# Patient Record
Sex: Male | Born: 2000 | Race: Black or African American | Hispanic: No | Marital: Single | Smoking: Never smoker
Health system: Southern US, Community
[De-identification: ages and names within clinical notes are randomized; demographics above are authoritative.]

## PROBLEM LIST (undated history)

## (undated) DIAGNOSIS — L409 Psoriasis, unspecified: Secondary | ICD-10-CM

## (undated) DIAGNOSIS — J45909 Unspecified asthma, uncomplicated: Secondary | ICD-10-CM

## (undated) HISTORY — DX: Psoriasis, unspecified: L40.9

---
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
## 2019-05-07 IMAGING — CR DG CHEST 2V
1 series · 2 of 2 positions shown · non-contrast
Comparison: 12/26/2007

CLINICAL DATA: wheezing and congestion x a few days. Denies known
fever. Reports living with his aunt for a while who had cats,
reports the symptoms may be related. Hx of asthma. Non-smoker. Best
obtainable images- patient with hair beads, unable to fully remove
from field of view for images.

EXAM:
CHEST - 2 VIEW

[Series 1: dg chest 2 view · 0.14mm/px · 2 of 2 slices shown]
[im 1/2]
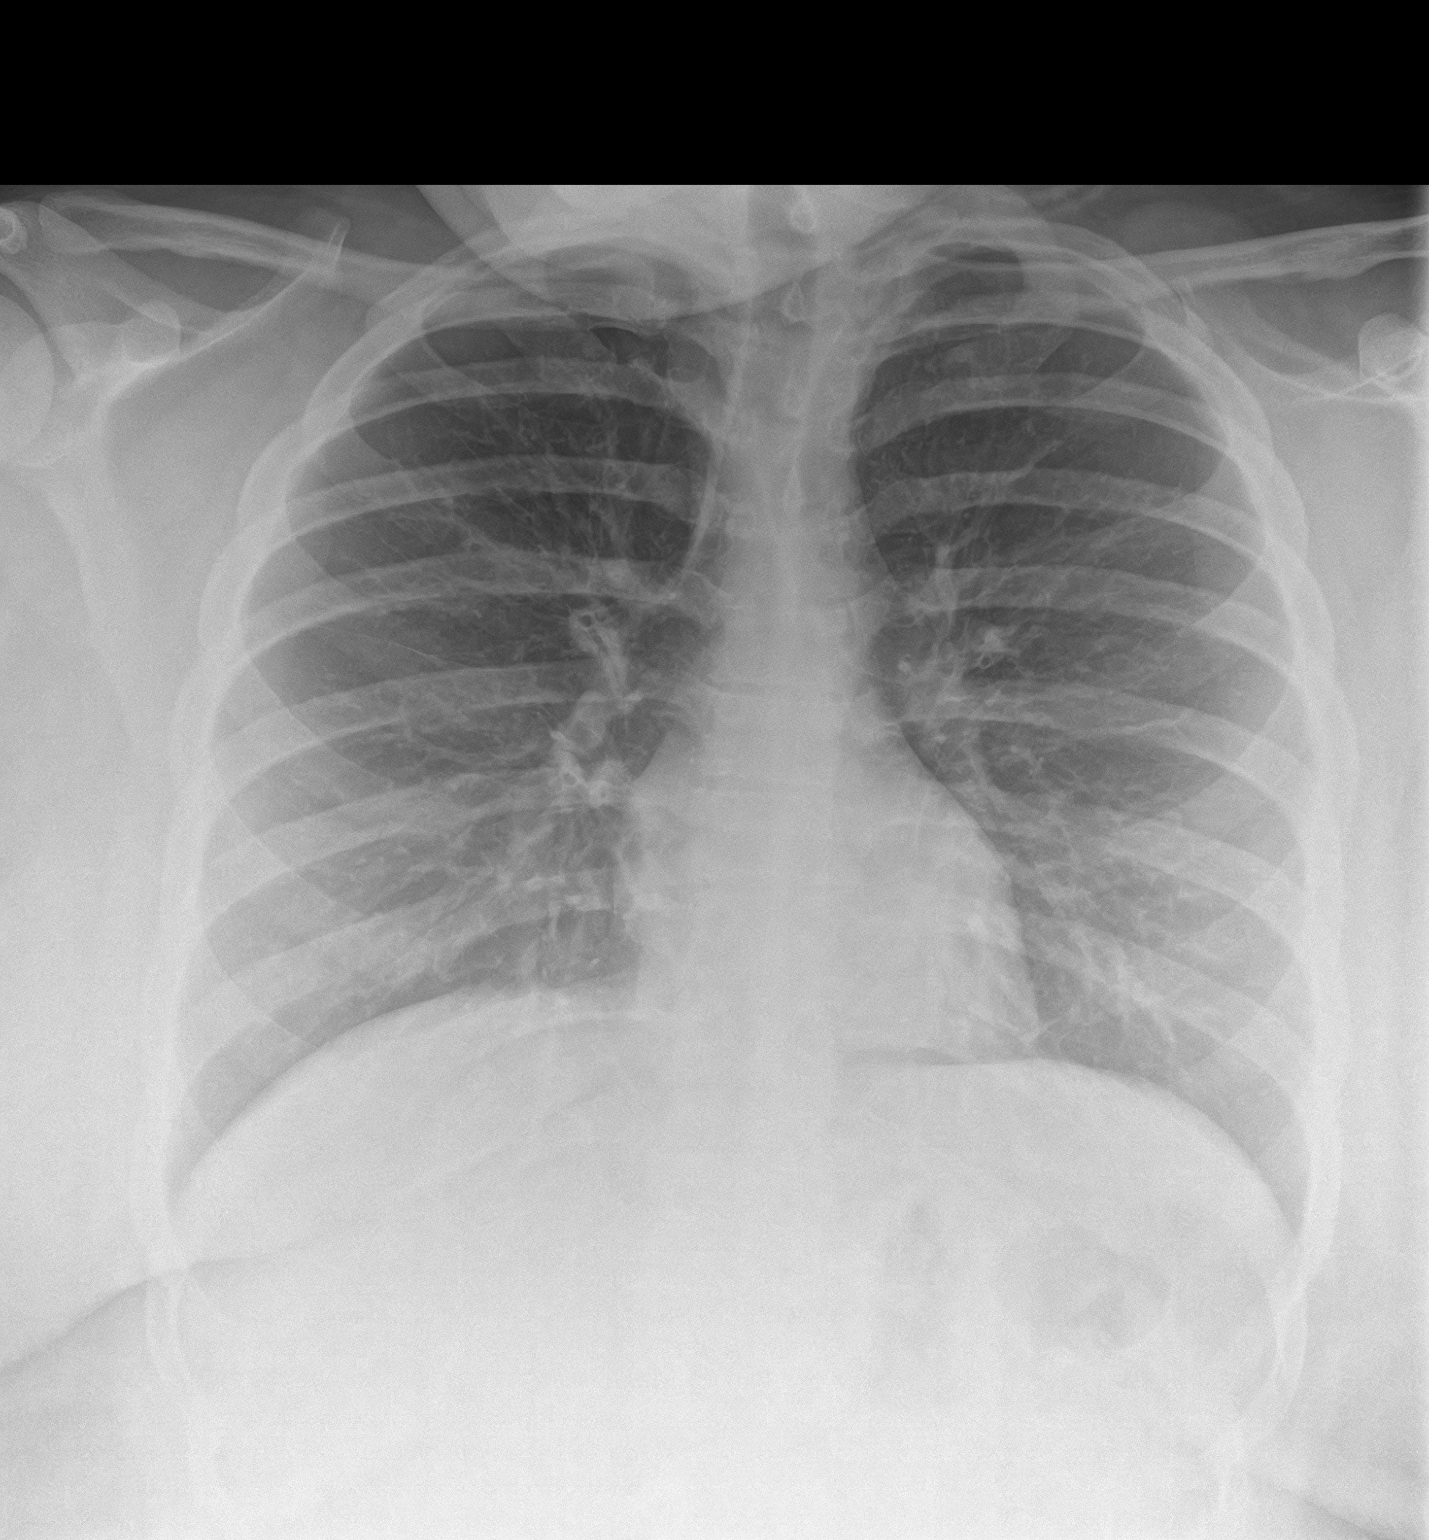
[im 2/2]
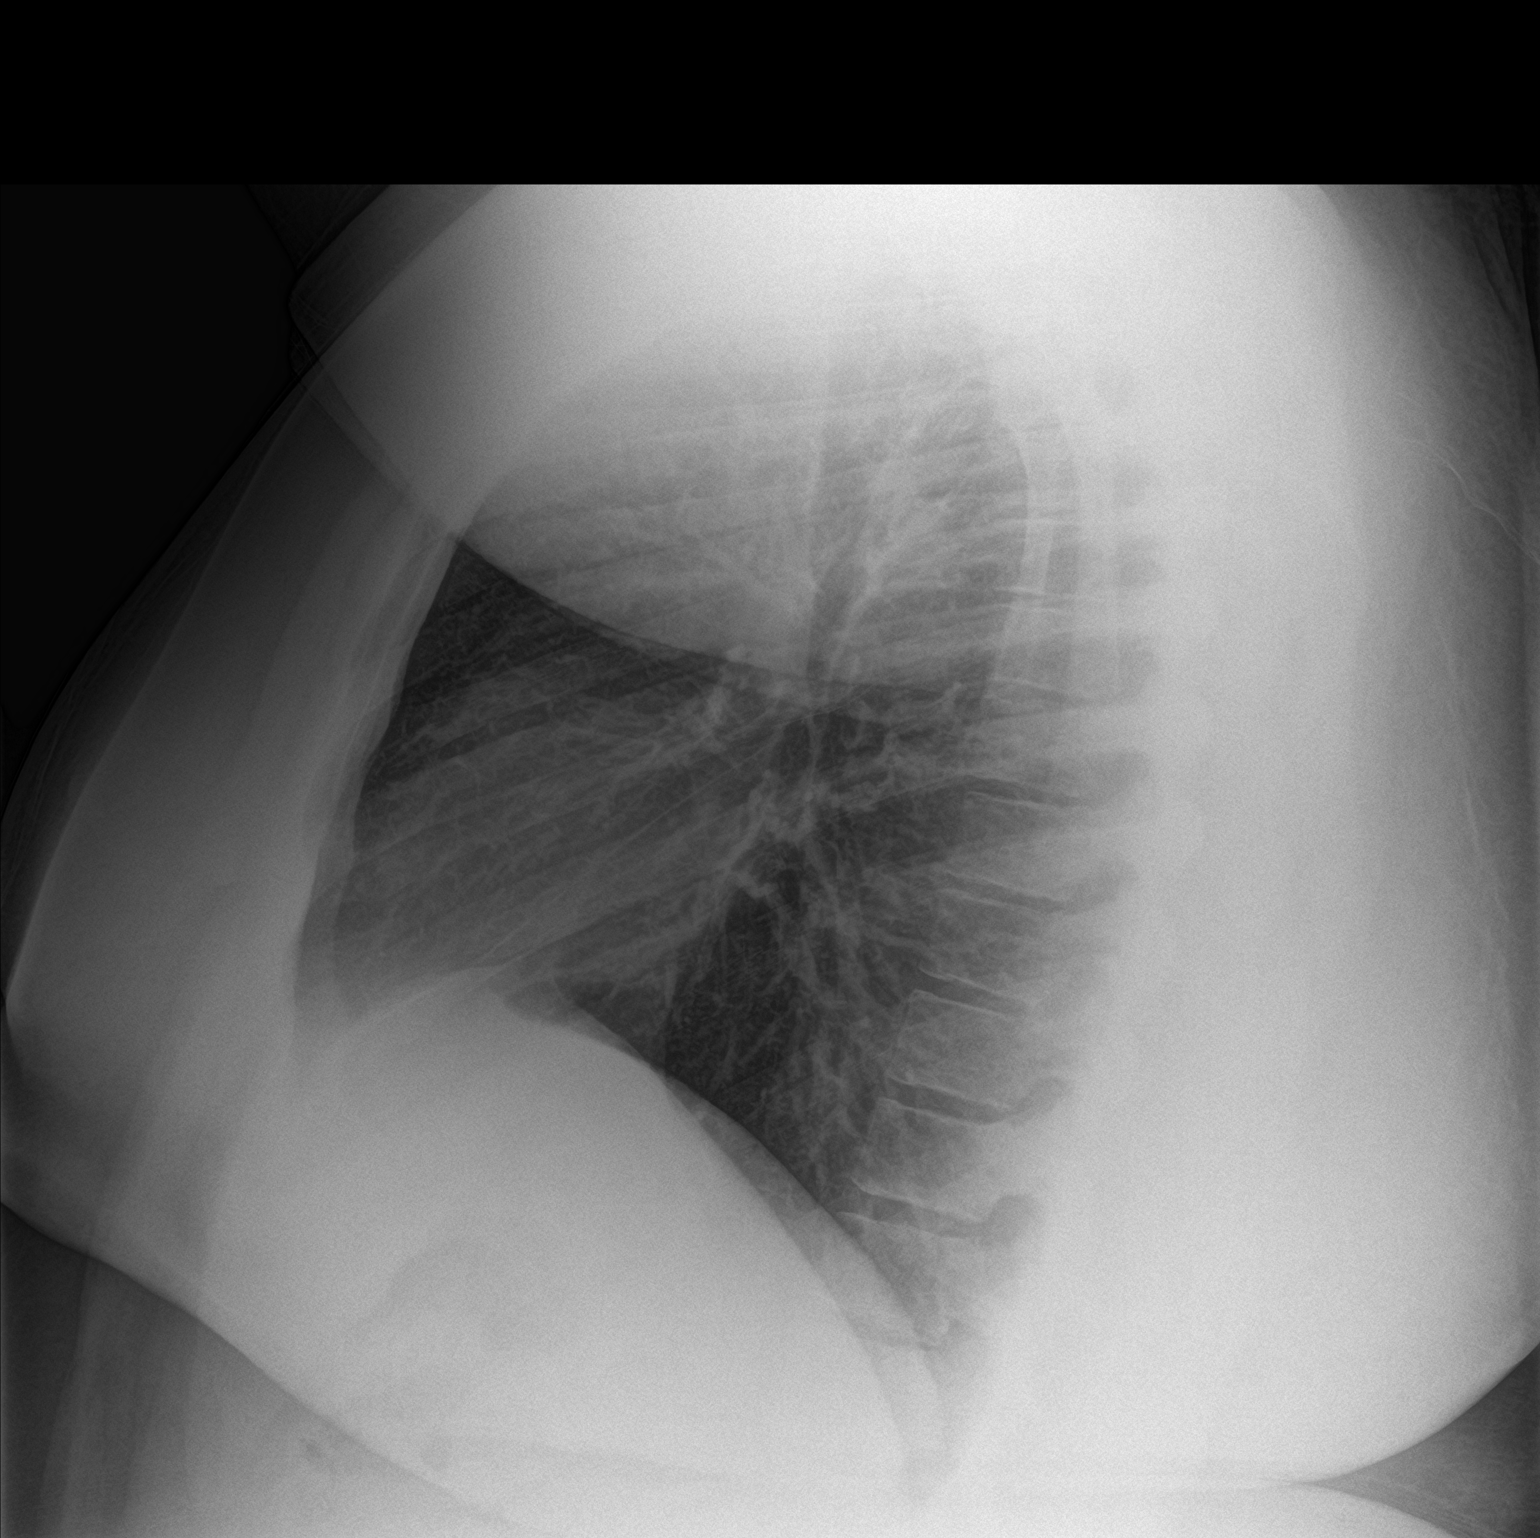

[2 of 2 positions shown; findings below may reference images not displayed]

FINDINGS: Lungs are clear. Somewhat coarse perihilar bronchovascular markings
as before.

Heart size and mediastinal contours are within normal limits.

No effusion.

Visualized bones unremarkable.
IMPRESSION: No acute cardiopulmonary disease.

## 2019-05-18 ENCOUNTER — Ambulatory Visit: Payer: Self-pay | Admitting: Family Medicine

## 2019-06-25 DIAGNOSIS — Z79899 Other long term (current) drug therapy: Secondary | ICD-10-CM | POA: Diagnosis not present

## 2019-06-25 DIAGNOSIS — F640 Transsexualism: Secondary | ICD-10-CM | POA: Diagnosis not present

## 2020-01-30 DIAGNOSIS — Z6841 Body Mass Index (BMI) 40.0 and over, adult: Secondary | ICD-10-CM | POA: Diagnosis not present

## 2020-10-02 ENCOUNTER — Ambulatory Visit: Payer: Medicaid Other | Admitting: Family Medicine

## 2020-12-03 DIAGNOSIS — R5383 Other fatigue: Secondary | ICD-10-CM | POA: Diagnosis not present

## 2020-12-03 DIAGNOSIS — Z1159 Encounter for screening for other viral diseases: Secondary | ICD-10-CM | POA: Diagnosis not present

## 2020-12-03 DIAGNOSIS — Z0000 Encounter for general adult medical examination without abnormal findings: Secondary | ICD-10-CM | POA: Diagnosis not present

## 2020-12-03 DIAGNOSIS — L409 Psoriasis, unspecified: Secondary | ICD-10-CM | POA: Diagnosis not present

## 2020-12-03 DIAGNOSIS — Z789 Other specified health status: Secondary | ICD-10-CM | POA: Diagnosis not present

## 2020-12-03 DIAGNOSIS — E559 Vitamin D deficiency, unspecified: Secondary | ICD-10-CM | POA: Diagnosis not present

## 2020-12-03 DIAGNOSIS — Z131 Encounter for screening for diabetes mellitus: Secondary | ICD-10-CM | POA: Diagnosis not present

## 2020-12-03 DIAGNOSIS — Z1339 Encounter for screening examination for other mental health and behavioral disorders: Secondary | ICD-10-CM | POA: Diagnosis not present

## 2020-12-03 DIAGNOSIS — Z13220 Encounter for screening for lipoid disorders: Secondary | ICD-10-CM | POA: Diagnosis not present

## 2021-08-13 ENCOUNTER — Encounter: Payer: Self-pay | Admitting: Family Medicine

## 2021-08-13 ENCOUNTER — Ambulatory Visit (INDEPENDENT_AMBULATORY_CARE_PROVIDER_SITE_OTHER): Payer: 59 | Admitting: Family Medicine

## 2021-08-13 VITALS — BP 132/82 | HR 88 | Ht 67.0 in | Wt >= 6400 oz

## 2021-08-13 DIAGNOSIS — K5904 Chronic idiopathic constipation: Secondary | ICD-10-CM | POA: Diagnosis not present

## 2021-08-13 DIAGNOSIS — L409 Psoriasis, unspecified: Secondary | ICD-10-CM | POA: Diagnosis not present

## 2021-08-13 DIAGNOSIS — L400 Psoriasis vulgaris: Secondary | ICD-10-CM | POA: Insufficient documentation

## 2021-08-13 MED ORDER — HYDROXYZINE PAMOATE 25 MG PO CAPS: 25.0000 mg | ORAL_CAPSULE | Freq: Four times a day (QID) | ORAL | 0 refills | Status: DC | PRN

## 2021-08-13 MED ORDER — SENNOSIDES-DOCUSATE SODIUM 8.6-50 MG PO TABS: 2.0000 | ORAL_TABLET | Freq: Two times a day (BID) | ORAL | 0 refills | Status: DC

## 2021-08-13 MED ORDER — METHYLPREDNISOLONE 4 MG PO TBPK: ORAL_TABLET | ORAL | 0 refills | Status: DC

## 2021-08-13 MED ORDER — POLYETHYLENE GLYCOL 3350 17 G PO PACK: 17.0000 g | PACK | Freq: Two times a day (BID) | ORAL | 11 refills | Status: DC

## 2021-08-13 NOTE — Assessment & Plan Note (Addendum)
Chronic issue with worsening symptomatology over the past 1 week, does describe change in eating habits roughly 1 week prior.  Describes bowel movements every several days, abdominal pain that alleviates after defecation, feeling incomplete bowel movements, no nausea emesis or blood in stool endorsed, no treatments to date.  Abdominal examination does reveal extensive psoriatic plaques on inspection, abdomen is soft, nontender, hypoactive bowel sounds, no hepatosplenomegaly.  Stated symptomatology is most consistent with chronic constipation with recent exacerbation.  Lifestyle modification methods reviewed, additionally I have advised her to initiate scheduled MiraLAX, and incorporate senna-docusate if bowel movement not achieved after 3 days of MiraLAX.  Patient is to continue this medication until follow-up in 1 month.

## 2021-08-13 NOTE — Progress Notes (Signed)
Primary Care / Sports Medicine Office Visit  Patient Information:  Patient ID: Bobby Russo, adult DOB: 2000-09-02 Age: 21 y.o. MRN: [id number]   Bobby Russo is a pleasant 21 y.o. adult presenting with the following:  Chief Complaint  Patient presents with   Establish Care    Hormone therapy, derm referral, bowel movements are not regular causing stomach issues    Vitals:   08/13/21 1330  BP: 132/82  Pulse: 88  SpO2: 98%   Vitals:   08/13/21 1330  Weight: (!) 439 lb (199.1 kg)  Height: 5\' 7"  (1.702 m)   Body mass index is 68.76 kg/m.  No results found.   Independent interpretation of notes and tests performed by another provider:   None  Procedures performed:   None  Pertinent History, Exam, Impression, and Recommendations:   Problem List Items Addressed This Visit       Digestive   Chronic idiopathic constipation    Chronic issue with worsening symptomatology over the past 1 week, does describe change in eating habits roughly 1 week prior.  Describes bowel movements every several days, abdominal pain that alleviates after defecation, feeling incomplete bowel movements, no nausea emesis or blood in stool endorsed, no treatments to date.  Abdominal examination does reveal extensive psoriatic plaques on inspection, abdomen is soft, nontender, hypoactive bowel sounds, no hepatosplenomegaly.  Stated symptomatology is most consistent with chronic constipation with recent exacerbation.  Lifestyle modification methods reviewed, additionally I have advised her to initiate scheduled MiraLAX, and incorporate senna-docusate if bowel movement not achieved after 3 days of MiraLAX.  Patient is to continue this medication until follow-up in 1 month.       Relevant Medications   polyethylene glycol (MIRALAX / GLYCOLAX) 17 g packet   senna-docusate (SENOKOT-S) 8.6-50 MG tablet     Musculoskeletal and Integument   Plaque psoriasis - Primary    Patient presents for  establishment of care and with concern over severe flare of psoriasis.  Patient was diagnosed during her early teens, after varying topical treatments with limited response, was started on Biologics through dermatology with excellent response.  Since moving to [place] roughly 1 year prior, has been off of any medications and has noted significant progressive worsening.  Examination today reveals extensive plaque psoriasis throughout the face, scalp, back, abdomen, and extremities.  Given the broad distribution and severity of findings I have advised course of steroids, urgent referral to dermatology, and supportive care over interim.       Relevant Medications   methylPREDNISolone (MEDROL DOSEPAK) 4 MG TBPK tablet   hydrOXYzine (VISTARIL) 25 MG capsule     Orders & Medications Meds ordered this encounter  Medications   methylPREDNISolone (MEDROL DOSEPAK) 4 MG TBPK tablet    Sig: Take per package instructions for full course, 6, 5, 4, 3, 2, 1    Dispense:  21 tablet    Refill:  0   hydrOXYzine (VISTARIL) 25 MG capsule    Sig: Take 1 capsule (25 mg total) by mouth every 6 (six) hours as needed for itching.    Dispense:  30 capsule    Refill:  0   polyethylene glycol (MIRALAX / GLYCOLAX) 17 g packet    Sig: Take 17 g by mouth 2 (two) times daily. Until stooling regularly    Dispense:  30 packet    Refill:  11   senna-docusate (SENOKOT-S) 8.6-50 MG tablet    Sig: Take 2 tablets by mouth 2 (two) times  daily. Until stooling regularly    Dispense:  60 tablet    Refill:  0   Orders Placed This Encounter  Procedures   Ambulatory referral to Dermatology     Return in about 1 month (around 09/13/2021) for physical.     Montel Culver, MD   [place]

## 2021-08-13 NOTE — Patient Instructions (Signed)
-   Start Medrol Dosepak (steroid) course - Can use hydroxyzine on an as-needed basis for itching - Review information provided on skin care until you are able to establish with dermatology - Start MiraLAX daily, can increase every 3 days, aim for once daily smooth bowel movement - If no bowel movement after 3 days of MiraLAX, dose senna-docusate up to twice a day - Review information provided on lifestyle/diet change - Return for follow-up in 1 month for annual physical, contact for any questions between now and then

## 2021-08-13 NOTE — Assessment & Plan Note (Addendum)
Patient presents for establishment of care and with concern over severe flare of psoriasis.  Patient was diagnosed during her early teens, after varying topical treatments with limited response, was started on Biologics through dermatology with excellent response.  Since moving to [place] roughly 1 year prior, has been off of any medications and has noted significant progressive worsening.  Examination today reveals extensive plaque psoriasis throughout the face, scalp, back, abdomen, and extremities.  Given the broad distribution and severity of findings I have advised course of steroids, urgent referral to dermatology, and supportive care over interim.

## 2021-09-11 ENCOUNTER — Ambulatory Visit (INDEPENDENT_AMBULATORY_CARE_PROVIDER_SITE_OTHER): Payer: 59 | Admitting: Family Medicine

## 2021-09-11 ENCOUNTER — Encounter: Payer: Self-pay | Admitting: Family Medicine

## 2021-09-11 VITALS — BP 134/84 | HR 88 | Ht 67.0 in | Wt >= 6400 oz

## 2021-09-11 DIAGNOSIS — L400 Psoriasis vulgaris: Secondary | ICD-10-CM | POA: Diagnosis not present

## 2021-09-11 DIAGNOSIS — Z0000 Encounter for general adult medical examination without abnormal findings: Secondary | ICD-10-CM | POA: Diagnosis not present

## 2021-09-11 DIAGNOSIS — Z1159 Encounter for screening for other viral diseases: Secondary | ICD-10-CM | POA: Diagnosis not present

## 2021-09-11 DIAGNOSIS — R69 Illness, unspecified: Secondary | ICD-10-CM | POA: Diagnosis not present

## 2021-09-11 DIAGNOSIS — Z13220 Encounter for screening for lipoid disorders: Secondary | ICD-10-CM | POA: Diagnosis not present

## 2021-09-11 DIAGNOSIS — B351 Tinea unguium: Secondary | ICD-10-CM | POA: Diagnosis not present

## 2021-09-11 DIAGNOSIS — K5904 Chronic idiopathic constipation: Secondary | ICD-10-CM | POA: Diagnosis not present

## 2021-09-11 DIAGNOSIS — Z114 Encounter for screening for human immunodeficiency virus [HIV]: Secondary | ICD-10-CM | POA: Diagnosis not present

## 2021-09-11 DIAGNOSIS — Z789 Other specified health status: Secondary | ICD-10-CM | POA: Insufficient documentation

## 2021-09-11 DIAGNOSIS — R7989 Other specified abnormal findings of blood chemistry: Secondary | ICD-10-CM | POA: Diagnosis not present

## 2021-09-11 DIAGNOSIS — R400 Somnolence: Secondary | ICD-10-CM

## 2021-09-11 NOTE — Assessment & Plan Note (Signed)
Patient did note mild improvement following oral steroid course, does have upcoming visit with dermatology.  Pruritus has diminished and does continue to use hydroxyzine on a as needed basis.

## 2021-09-11 NOTE — Assessment & Plan Note (Signed)
Continued improvement noted following lifestyle changes as well as MiraLAX regular dosing, does use senna-docusate for breakthrough symptoms.

## 2021-09-11 NOTE — Assessment & Plan Note (Signed)
Annual examination completed, risk stratification labs ordered, anticipatory guidance provided.  We will follow labs once resulted.  Deferred Tdap.

## 2021-09-11 NOTE — Assessment & Plan Note (Signed)
Incidentally noted on today's physical examination, is amenable to further evaluation management by podiatry, referral placed today.

## 2021-09-11 NOTE — Progress Notes (Signed)
Annual Physical Exam Visit  Patient Information:  Patient ID: Bobby Russo, adult DOB: [birth year] Age: 21 y.o. MRN: [id number]   Subjective:   CC: Annual Physical Exam  HPI:  Bobby Russo is here for their annual physical.  I reviewed the past medical history, family history, social history, surgical history, and allergies today and changes were made as necessary.  Please see the problem list section below for additional details.  Past Medical History: Past Medical History:  Diagnosis Date   Asthma    Psoriasis    Past Surgical History: History reviewed. No pertinent surgical history. Family History: Family History  Problem Relation Age of Onset   Diabetes Maternal Grandmother    Allergies: Allergies  Allergen Reactions   Bee Venom Other (See Comments)    unknown unknown   Health Maintenance: Health Maintenance  Topic Date Due   COVID-19 Vaccine (1) Never done   HPV VACCINES (1 - 2-dose series) Never done   Hepatitis C Screening  Never done   PAP-Cervical Cytology Screening  Never done   PAP SMEAR-Modifier  Never done   TETANUS/TDAP  09/12/2022 (Originally 02/10/2018)   INFLUENZA VACCINE  10/27/2021   HIV Screening  Completed    HM Colonoscopy     This patient has no relevant Health Maintenance data.      Medications: Current Outpatient Medications on File Prior to Visit  Medication Sig Dispense Refill   hydrOXYzine (VISTARIL) 25 MG capsule Take 1 capsule (25 mg total) by mouth every 6 (six) hours as needed for itching. 30 capsule 0   polyethylene glycol (MIRALAX / GLYCOLAX) 17 g packet Take 17 g by mouth 2 (two) times daily. Until stooling regularly 30 packet 11   senna-docusate (SENOKOT-S) 8.6-50 MG tablet Take 2 tablets by mouth 2 (two) times daily. Until stooling regularly 60 tablet 0   No current facility-administered medications on file prior to visit.    Review of Systems: No headache, visual changes, nausea, vomiting, diarrhea,  constipation, dizziness, abdominal pain, skin rash, fevers, chills, night sweats, swollen lymph nodes, weight loss, chest pain, body aches, joint swelling, muscle aches, shortness of breath, mood changes, visual or auditory hallucinations reported.  Objective:   Vitals:   09/11/21 1340  BP: 134/84  Pulse: 88  SpO2: 98%   Vitals:   09/11/21 1340  Weight: (!) 435 lb (197.3 kg)  Height: 5\' 7"  (1.702 m)   Body mass index is 68.13 kg/m.  General: Well Developed, obese, well nourished, and in no acute distress.  Neuro: Alert and oriented x3, extra-ocular muscles intact, sensation grossly intact. Cranial nerves II through XII are grossly intact, motor, sensory, and coordinative functions are intact. HEENT: Normocephalic, atraumatic, pupils equal round reactive to light, neck supple, no masses, no lymphadenopathy, thyroid nonpalpable. Oropharynx, nasopharynx, external ear canals are unremarkable. Skin: Warm and dry, whole body distribution of psoriatic plaques.  Cardiac: Regular rate and rhythm, no murmurs rubs or gallops. No peripheral edema. Pulses symmetric. Respiratory: Clear to auscultation bilaterally. Not using accessory muscles, speaking in full sentences.  Abdominal: Soft, nontender, nondistended, positive bowel sounds, no masses, no organomegaly. Musculoskeletal: Shoulder, elbow, wrist, hip, knee, ankle stable, and with full range of motion.  Impression and Recommendations:   The patient was counselled, risk factors were discussed, and anticipatory guidance given.  Problem List Items Addressed This Visit       Digestive   Chronic idiopathic constipation    Continued improvement noted following lifestyle changes as well as MiraLAX  regular dosing, does use senna-docusate for breakthrough symptoms.        Musculoskeletal and Integument   Plaque psoriasis    Patient did note mild improvement following oral steroid course, does have upcoming visit with dermatology.  Pruritus  has diminished and does continue to use hydroxyzine on a as needed basis.      Onychomycosis    Incidentally noted on today's physical examination, is amenable to further evaluation management by podiatry, referral placed today.      Relevant Orders   Ambulatory referral to Podiatry     Other   Annual physical exam - Primary    Annual examination completed, risk stratification labs ordered, anticipatory guidance provided.  We will follow labs once resulted.  Deferred Tdap.      Relevant Orders   Apo A1 + B + Ratio   CBC   Comprehensive metabolic panel   Hepatitis C antibody   VITAMIN D 25 Hydroxy (Vit-D Deficiency, Fractures)   TSH   Lipid panel   HIV Antibody (routine testing w rflx)   Daytime somnolence    Given BMI and elevated blood pressure readings concern for OSA.  We will further assess this with home sleep study to be ordered.      Male-to-male transgender person    Patient is interested in restarting hormone therapy since moving to [place], previously obtaining this through [facility].  She has not had a chance to reach out to local [facility] groups and I have advised her on the closest location [facility]).  If they are unable to fulfill her request, we can seek [facility] options and place referrals accordingly.      Other Visit Diagnoses     Screening for HIV (human immunodeficiency virus)       Relevant Orders   HIV Antibody (routine testing w rflx)   Need for hepatitis C screening test       Relevant Orders   Hepatitis C antibody   Screening for lipoid disorders       Relevant Orders   Apo A1 + B + Ratio   Lipid panel   Low serum vitamin D       Relevant Orders   VITAMIN D 25 Hydroxy (Vit-D Deficiency, Fractures)        Orders & Medications Medications: No orders of the defined types were placed in this encounter.  Orders Placed This Encounter  Procedures   Apo A1 + B + Ratio   CBC   Comprehensive metabolic panel    Hepatitis C antibody   VITAMIN D 25 Hydroxy (Vit-D Deficiency, Fractures)   TSH   Lipid panel   HIV Antibody (routine testing w rflx)   Ambulatory referral to Podiatry     Return in about 2 months (around 11/11/2021).    Jerrol Banana, MD   Primary Care Sports Medicine [facility]

## 2021-09-11 NOTE — Patient Instructions (Signed)
-   Obtain fasting labs with orders provided (can have water or black coffee but otherwise no food or drink x 8 hours before labs) - Review information provided - Attend eye doctor annually, dentist every 6 months, work towards or maintain 30 minutes of moderate intensity physical activity at least 5 days per week, and consume a balanced diet - Return in 2 months for follow-up - Contact us for any questions between now and then

## 2021-09-11 NOTE — Assessment & Plan Note (Signed)
Patient is interested in restarting hormone therapy since moving to [place], previously obtaining this through [facility].  She has not had a chance to reach out to local [facility] groups and I have advised her on the closest location [facility]).  If they are unable to fulfill her request, we can seek [facility] options and place referrals accordingly.

## 2021-09-11 NOTE — Assessment & Plan Note (Signed)
Given BMI and elevated blood pressure readings concern for OSA.  We will further assess this with home sleep study to be ordered.

## 2021-09-24 ENCOUNTER — Ambulatory Visit: Payer: 59 | Admitting: Podiatry

## 2021-10-01 ENCOUNTER — Ambulatory Visit: Payer: Medicaid Other | Admitting: Dermatology

## 2021-10-13 ENCOUNTER — Ambulatory Visit (INDEPENDENT_AMBULATORY_CARE_PROVIDER_SITE_OTHER): Payer: 59 | Admitting: Dermatology

## 2021-10-13 DIAGNOSIS — Z79899 Other long term (current) drug therapy: Secondary | ICD-10-CM

## 2021-10-13 DIAGNOSIS — L409 Psoriasis, unspecified: Secondary | ICD-10-CM

## 2021-10-13 MED ORDER — ZORYVE 0.3 % EX CREA: TOPICAL_CREAM | CUTANEOUS | 2 refills | Status: DC

## 2021-10-13 NOTE — Progress Notes (Signed)
   New Patient Visit  Subjective  Bobby Russo is a 21 y.o. adult who presents for the following: Psoriasis (Pt c/o long hx of Psoriasis all over the body, treated with Cosentyx injections ~2 years ago when she lived in [place] with a good response, psoriasis completely cleared up. Pt has tried and failed topical steroids Mometasone, Triamcinolone, Clobetasol, Betamethasone cream and Ketoconazole cream, tried [place] tablets briefly with a poor response.  No current treatment  ).  Hx of frequent upset stomach    The following portions of the chart were reviewed this encounter and updated as appropriate:   Tobacco  Allergies  Meds  Problems  Med Hx  Surg Hx  Fam Hx      Review of Systems:  No other skin or systemic complaints except as noted in HPI or Assessment and Plan.  Objective  Well appearing patient in no apparent distress; mood and affect are within normal limits.  A focused examination was performed including face,arms,chest,legs. Relevant physical exam findings are noted in the Assessment and Plan.  scalp, face,chest,back,legs Well-demarcated erythematous papules/plaques with silvery scale, guttate pink scaly papules, 40% BSA    Assessment & Plan  Psoriasis scalp, face,chest,back,legs  Chronic and persistent condition with duration or expected duration over one year. Condition is bothersome/symptomatic for patient. Currently flared.  Previously did very well on Cosentyx  Psoriasis is a chronic non-curable, but treatable genetic/hereditary disease that may have other systemic features affecting other organ systems such as joints (Psoriatic Arthritis). It is associated with an increased risk of inflammatory bowel disease, heart disease, non-alcoholic fatty liver disease, and depression.   40% BSA     Reviewed risks of biologics including immunosuppression, infections, injection site reaction, and failure to improve condition. Goal is control of skin  condition, not cure.  Some older biologics such as Humira and Enbrel may slightly increase risk of malignancy and may worsen congestive heart failure.  Talz and Cosentyx may cause inflammatory bowel disease to flare. The use of biologics requires long term medication management, including periodic office visits and monitoring of blood work.   Labs ordered CMP, CBC with diff, Hepatitis B antibody, Hepatitis B antigen, Hepatitis C antibody, Hepatitis B core antibody, HIV, Quantiferon gold   Pending normal labs we will start Cosentyx injections   Start Zoryve cream apply to affected skin qd   Denies joint pain    Related Procedures Comprehensive metabolic panel CBC with Differential/Platelet Hepatitis B surface antibody,qualitative Hepatitis B surface antigen Hepatitis C antibody HIV Antibody (routine testing w rflx) QuantiFERON-TB Gold Plus Hepatitis B core antibody, total   Return in about 3 months (around 01/13/2022) for Psoriasis .   I, Angelique Holm, CMA, am acting as scribe for Darden Dates, MD .   Documentation: I have reviewed the above documentation for accuracy and completeness, and I agree with the above.  Darden Dates, MD

## 2021-10-13 NOTE — Patient Instructions (Signed)
Due to recent changes in healthcare laws, you may see results of your pathology and/or laboratory studies on MyChart before the doctors have had a chance to review them. We understand that in some cases there may be results that are confusing or concerning to you. Please understand that not all results are received at the same time and often the doctors may need to interpret multiple results in order to provide you with the best plan of care or course of treatment. Therefore, we ask that you please give us 2 business days to thoroughly review all your results before contacting the office for clarification. Should we see a critical lab result, you will be contacted sooner.   If You Need Anything After Your Visit  If you have any questions or concerns for your doctor, please call our main line at [phone] and press option 4 to reach your doctor's medical assistant. If no one answers, please leave a voicemail as directed and we will return your call as soon as possible. Messages left after 4 pm will be answered the following business day.   You may also send us a message via MyChart. We typically respond to MyChart messages within 1-2 business days.  For prescription refills, please ask your pharmacy to contact our office. Our fax number is [phone].  If you have an urgent issue when the clinic is closed that cannot wait until the next business day, you can page your doctor at the number below.    Please note that while we do our best to be available for urgent issues outside of office hours, we are not available 24/7.   If you have an urgent issue and are unable to reach us, you may choose to seek medical care at your doctor's office, retail clinic, urgent care center, or emergency room.  If you have a medical emergency, please immediately call 911 or go to the emergency department.  Pager Numbers  - Dr. Kowalski: [phone]  - Dr. Moye: [phone]  - Dr. Stewart:  [phone]  In the event of inclement weather, please call our main line at [phone] for an update on the status of any delays or closures.  Dermatology Medication Tips: Please keep the boxes that topical medications come in in order to help keep track of the instructions about where and how to use these. Pharmacies typically print the medication instructions only on the boxes and not directly on the medication tubes.   If your medication is too expensive, please contact our office at [phone] option 4 or send us a message through MyChart.   We are unable to tell what your co-pay for medications will be in advance as this is different depending on your insurance coverage. However, we may be able to find a substitute medication at lower cost or fill out paperwork to get insurance to cover a needed medication.   If a prior authorization is required to get your medication covered by your insurance company, please allow us 1-2 business days to complete this process.  Drug prices often vary depending on where the prescription is filled and some pharmacies may offer cheaper prices.  The website www.goodrx.com contains coupons for medications through different pharmacies. The prices here do not account for what the cost may be with help from insurance (it may be cheaper with your insurance), but the website can give you the price if you did not use any insurance.  - You can print the associated coupon and take it with   your prescription to the pharmacy.  - You may also stop by our office during regular business hours and pick up a GoodRx coupon card.  - If you need your prescription sent electronically to a different pharmacy, notify our office through [place] MyChart or by phone at [phone] option 4.     Si Usted Necesita Algo Despus de Su Visita  Tambin puede enviarnos un mensaje a travs de MyChart. Por lo general respondemos a los mensajes de MyChart en el transcurso de 1 a 2  das hbiles.  Para renovar recetas, por favor pida a su farmacia que se ponga en contacto con nuestra oficina. Nuestro nmero de fax es el [phone].  Si tiene un asunto urgente cuando la clnica est cerrada y que no puede esperar hasta el siguiente da hbil, puede llamar/localizar a su doctor(a) al nmero que aparece a continuacin.   Por favor, tenga en cuenta que aunque hacemos todo lo posible para estar disponibles para asuntos urgentes fuera del horario de oficina, no estamos disponibles las 24 horas del da, los 7 das de la semana.   Si tiene un problema urgente y no puede comunicarse con nosotros, puede optar por buscar atencin mdica  en el consultorio de su doctor(a), en una clnica privada, en un centro de atencin urgente o en una sala de emergencias.  Si tiene una emergencia mdica, por favor llame inmediatamente al 911 o vaya a la sala de emergencias.  Nmeros de bper  - Dr. Kowalski: [phone]  - Dra. Moye: [phone]  - Dra. Stewart: [phone]  En caso de inclemencias del tiempo, por favor llame a nuestra lnea principal al [phone] para una actualizacin sobre el estado de cualquier retraso o cierre.  Consejos para la medicacin en dermatologa: Por favor, guarde las cajas en las que vienen los medicamentos de uso tpico para ayudarle a seguir las instrucciones sobre dnde y cmo usarlos. Las farmacias generalmente imprimen las instrucciones del medicamento slo en las cajas y no directamente en los tubos del medicamento.   Si su medicamento es muy caro, por favor, pngase en contacto con nuestra oficina llamando al [phone] y presione la opcin 4 o envenos un mensaje a travs de MyChart.   No podemos decirle cul ser su copago por los medicamentos por adelantado ya que esto es diferente dependiendo de la cobertura de su seguro. Sin embargo, es posible que podamos encontrar un medicamento sustituto a menor costo o llenar un formulario para que el  seguro cubra el medicamento que se considera necesario.   Si se requiere una autorizacin previa para que su compaa de seguros cubra su medicamento, por favor permtanos de 1 a 2 das hbiles para completar este proceso.  Los precios de los medicamentos varan con frecuencia dependiendo del lugar de dnde se surte la receta y alguna farmacias pueden ofrecer precios ms baratos.  El sitio web www.goodrx.com tiene cupones para medicamentos de diferentes farmacias. Los precios aqu no tienen en cuenta lo que podra costar con la ayuda del seguro (puede ser ms barato con su seguro), pero el sitio web puede darle el precio si no utiliz ningn seguro.  - Puede imprimir el cupn correspondiente y llevarlo con su receta a la farmacia.  - Tambin puede pasar por nuestra oficina durante el horario de atencin regular y recoger una tarjeta de cupones de GoodRx.  - Si necesita que su receta se enve electrnicamente a una farmacia diferente, informe a nuestra oficina a travs de MyChart de    o por telfono llamando al [phone] y presione la opcin 4.  

## 2021-10-18 ENCOUNTER — Encounter: Payer: Self-pay | Admitting: Dermatology

## 2021-10-22 ENCOUNTER — Other Ambulatory Visit: Payer: Self-pay

## 2021-10-22 ENCOUNTER — Encounter: Payer: Self-pay | Admitting: Family Medicine

## 2021-10-22 DIAGNOSIS — Z6841 Body Mass Index (BMI) 40.0 and over, adult: Secondary | ICD-10-CM

## 2021-10-22 NOTE — Telephone Encounter (Signed)
Referral

## 2021-11-05 ENCOUNTER — Ambulatory Visit: Payer: 59 | Admitting: Podiatry

## 2021-11-16 ENCOUNTER — Ambulatory Visit: Payer: 59 | Admitting: Family Medicine

## 2021-11-18 ENCOUNTER — Encounter: Payer: Self-pay | Admitting: Family Medicine

## 2022-01-11 ENCOUNTER — Encounter: Payer: Self-pay | Admitting: Dermatology

## 2022-01-14 ENCOUNTER — Ambulatory Visit: Payer: 59 | Admitting: Dermatology

## 2022-02-02 ENCOUNTER — Ambulatory Visit (INDEPENDENT_AMBULATORY_CARE_PROVIDER_SITE_OTHER): Payer: 59 | Admitting: Dermatology

## 2022-02-02 DIAGNOSIS — L409 Psoriasis, unspecified: Secondary | ICD-10-CM

## 2022-02-02 MED ORDER — FLUOCINOLONE ACETONIDE BODY 0.01 % EX OIL: TOPICAL_OIL | CUTANEOUS | 2 refills | Status: DC

## 2022-02-02 NOTE — Patient Instructions (Addendum)
Continue Zoryve once daily to affected areas. Start fluocinolone oil 2 times daily for a few weeks then as needed to scalp. Avoid applying to face, groin, and axilla. Use as directed. Long-term use can cause thinning of the skin. Recommend T-Sal shampoo  Due to recent changes in healthcare laws, you may see results of your pathology and/or laboratory studies on MyChart before the doctors have had a chance to review them. We understand that in some cases there may be results that are confusing or concerning to you. Please understand that not all results are received at the same time and often the doctors may need to interpret multiple results in order to provide you with the best plan of care or course of treatment. Therefore, we ask that you please give [place] 2 business days to thoroughly review all your results before contacting the office for clarification. Should we see a critical lab result, you will be contacted sooner.   If You Need Anything After Your Visit  If you have any questions or concerns for your doctor, please call our main line at [phone] and press option 4 to reach your doctor's medical assistant. If no one answers, please leave a voicemail as directed and we will return your call as soon as possible. Messages left after 4 pm will be answered the following business day.   You may also send [place] a message via [place]. We typically respond to MyChart messages within 1-2 business days.  For prescription refills, please ask your pharmacy to contact our office. Our fax number is [phone].  If you have an urgent issue when the clinic is closed that cannot wait until the next business day, you can page your doctor at the number below.    Please note that while we do our best to be available for urgent issues outside of office hours, we are not available 24/7.   If you have an urgent issue and are unable to reach [place], you may choose to seek medical care at your doctor's office, retail  clinic, urgent care center, or emergency room.  If you have a medical emergency, please immediately call 911 or go to the emergency department.  Pager Numbers  - Dr. Nehemiah Massed: [phone]  - Dr. Laurence Ferrari: [phone]  - Dr. Nicole Kindred: [phone]  In the event of inclement weather, please call our main line at [phone] for an update on the status of any delays or closures.  Dermatology Medication Tips: Please keep the boxes that topical medications come in in order to help keep track of the instructions about where and how to use these. Pharmacies typically print the medication instructions only on the boxes and not directly on the medication tubes.   If your medication is too expensive, please contact our office at [phone] option 4 or send [place] a message through [place].   We are unable to tell what your co-pay for medications will be in advance as this is different depending on your insurance coverage. However, we may be able to find a substitute medication at lower cost or fill out paperwork to get insurance to cover a needed medication.   If a prior authorization is required to get your medication covered by your insurance company, please allow [place] 1-2 business days to complete this process.  Drug prices often vary depending on where the prescription is filled and some pharmacies may offer cheaper prices.  The website www.goodrx.com contains coupons for medications through different pharmacies. The prices here do not account  for what the cost may be with help from insurance (it may be cheaper with your insurance), but the website can give you the price if you did not use any insurance.  - You can print the associated coupon and take it with your prescription to the pharmacy.  - You may also stop by our office during regular business hours and pick up a GoodRx coupon card.  - If you need your prescription sent electronically to a different pharmacy, notify our office through [facility] or by phone at [phone] option 4.     Si Usted Necesita Algo Despus de Su Visita  Tambin puede enviarnos un mensaje a travs de Clinical cytogeneticist. Por lo general respondemos a los mensajes de MyChart en el transcurso de 1 a 2 das hbiles.  Para renovar recetas, por favor pida a su farmacia que se ponga en contacto con nuestra oficina. Annie Sable de fax es [place] [phone].  Si tiene un asunto urgente cuando la clnica est cerrada y que no puede esperar hasta el siguiente da hbil, puede llamar/localizar a su doctor(a) al nmero que aparece a continuacin.   Por favor, tenga en cuenta que aunque hacemos todo lo posible para estar disponibles para asuntos urgentes fuera del horario de [place], no estamos disponibles las 24 horas del da, los 7 [address]  [address] de la [place].   Si tiene un problema urgente y no puede comunicarse con nosotros, puede optar por buscar atencin mdica  en el consultorio de su doctor(a), en una clnica privada, en un centro de atencin urgente o en una sala de emergencias.  Si tiene Engineer, drilling, por favor llame inmediatamente al 911 o vaya a la sala de emergencias.  Nmeros de bper  - Dr. Gwen Pounds: [phone]  - Dra. Moye: [phone]  - Dra. Roseanne Reno: [phone]  En caso de inclemencias del [place], por favor llame a Lacy Duverney principal al [phone] para una actualizacin sobre el [place] de cualquier retraso o cierre.  Consejos para la medicacin en dermatologa: Por favor, guarde las cajas en las que vienen los medicamentos de uso tpico para ayudarle a seguir las instrucciones sobre dnde y cmo usarlos. Las farmacias generalmente imprimen las instrucciones del medicamento slo en las cajas y no directamente en los tubos del [place].   Si su medicamento es muy caro, por favor, pngase en contacto con Rolm Gala llamando al [phone] y presione la opcin 4 o envenos un mensaje a travs de Clinical cytogeneticist.   No podemos decirle  cul ser su copago por los medicamentos por adelantado ya que esto es diferente dependiendo de la cobertura de su seguro. Sin embargo, es posible que podamos encontrar un medicamento sustituto a Audiological scientist un formulario para que el seguro cubra el medicamento que se considera necesario.   Si se requiere una autorizacin previa para que su compaa de seguros [place] su medicamento, por favor permtanos de 1 a 2 das hbiles para completar [address]  Los precios de los medicamentos varan con frecuencia dependiendo del Environmental consultant de dnde se surte la receta y alguna farmacias pueden ofrecer precios ms baratos.  El sitio web www.goodrx.com tiene cupones para medicamentos de Health and safety inspector. Los precios aqu no tienen en cuenta lo que podra costar con la ayuda del seguro (puede ser ms barato con su seguro), pero el sitio web puede darle el precio si no utiliz Tourist information centre manager.  - Puede imprimir el cupn correspondiente y llevarlo con su receta a la farmacia.  Laroy Apple  puede pasar por nuestra oficina durante el horario de atencin regular y Charity fundraiser una tarjeta de cupones de GoodRx.  - Si necesita que su receta se enve electrnicamente a una farmacia diferente, informe a nuestra oficina a travs de MyChart de [place] o por telfono llamando al [phone] y presione la opcin 4.

## 2022-02-02 NOTE — Progress Notes (Signed)
   Follow-Up Visit   Subjective  Bobby Russo is a 21 y.o. adult who presents for the following: Psoriasis (Patient here today for psoriasis follow up. Patient previously was on Cosentyx, waiting for labs to restart. Patient advises she did blood work with [employer] but it is not showing in lab results. She is currently using Zoryve and is doing well with it. ).   The following portions of the chart were reviewed this encounter and updated as appropriate:   Tobacco  Allergies  Meds  Problems  Med Hx  Surg Hx  Fam Hx      Review of Systems:  No other skin or systemic complaints except as noted in HPI or Assessment and Plan.  Objective  Well appearing patient in no apparent distress; mood and affect are within normal limits.  A focused examination was performed including legs. Relevant physical exam findings are noted in the Assessment and Plan.  scalp, face,chest,back,legs Widespread moderately thick scaly pink plaques    Assessment & Plan  Psoriasis scalp, face,chest,back,legs  Psoriasis is a chronic non-curable, but treatable genetic/hereditary disease that may have other systemic features affecting other organ systems such as joints (Psoriatic Arthritis). It is associated with an increased risk of inflammatory bowel disease, heart disease, non-alcoholic fatty liver disease, and depression.    Chronic and persistent condition with duration or expected duration over one year. Condition is bothersome/symptomatic for patient. Currently flared.  Patient will follow up with [facility] on blood work. Patient was given order for labs needed.  Patient does c/o joint pain.   Pending lab results, will restart Cosentyx injection.   Continue Zoryve once daily to affected areas. Start fluocinolone oil 2 times daily for a few weeks then as needed to scalp. Avoid applying to face, groin, and axilla. Use as directed. Long-term use can cause thinning of the skin. Recommend T-Sal  shampoo  Fluocinolone Acetonide Body 0.01 % OIL - scalp, face,chest,back,legs Apply to scalp twice daily for a few weeks then as needed for flares. Avoid applying to face, groin, and axilla. Use as directed. Long-term use can cause thinning of the skin.   Return in about 3 months (around 05/05/2022) for Psoriasis.  Graciella Belton, RMA, am acting as scribe for Forest Gleason, MD .  Documentation: I have reviewed the above documentation for accuracy and completeness, and I agree with the above.  Forest Gleason, MD

## 2022-02-06 ENCOUNTER — Encounter: Payer: Self-pay | Admitting: Dermatology

## 2022-02-11 ENCOUNTER — Other Ambulatory Visit: Payer: Self-pay

## 2022-02-11 DIAGNOSIS — L409 Psoriasis, unspecified: Secondary | ICD-10-CM

## 2022-02-11 MED ORDER — FLUOCINOLONE ACETONIDE BODY 0.01 % EX OIL: TOPICAL_OIL | CUTANEOUS | 2 refills | Status: DC

## 2022-04-02 ENCOUNTER — Encounter: Payer: Self-pay | Admitting: Dermatology

## 2022-04-07 LAB — CBC WITH DIFFERENTIAL/PLATELET
Basophils Absolute: 0 10*3/uL (ref 0.0–0.2)
Basos: 0 %
EOS (ABSOLUTE): 0.2 10*3/uL (ref 0.0–0.4)
Eos: 2 %
Hematocrit: 46.1 % (ref 37.5–51.0)
Hemoglobin: 14.8 g/dL (ref 13.0–17.7)
Immature Grans (Abs): 0 10*3/uL (ref 0.0–0.1)
Immature Granulocytes: 0 %
Lymphocytes Absolute: 3.8 10*3/uL — ABNORMAL HIGH (ref 0.7–3.1)
Lymphs: 40 %
MCH: 26.3 pg — ABNORMAL LOW (ref 26.6–33.0)
MCHC: 32.1 g/dL (ref 31.5–35.7)
MCV: 82 fL (ref 79–97)
Monocytes Absolute: 0.7 10*3/uL (ref 0.1–0.9)
Monocytes: 7 %
Neutrophils Absolute: 4.8 10*3/uL (ref 1.4–7.0)
Neutrophils: 51 %
Platelets: 330 10*3/uL (ref 150–450)
RBC: 5.63 x10E6/uL (ref 4.14–5.80)
RDW: 14.1 % (ref 11.6–15.4)
WBC: 9.4 10*3/uL (ref 3.4–10.8)

## 2022-04-07 LAB — COMPREHENSIVE METABOLIC PANEL
ALT: 15 IU/L (ref 0–44)
AST: 31 IU/L (ref 0–40)
Albumin/Globulin Ratio: 1.2 (ref 1.2–2.2)
Albumin: 4.7 g/dL (ref 4.3–5.2)
Alkaline Phosphatase: 83 IU/L (ref 44–121)
BUN/Creatinine Ratio: 14 (ref 9–20)
BUN: 11 mg/dL (ref 6–20)
Bilirubin Total: 0.5 mg/dL (ref 0.0–1.2)
CO2: 18 mmol/L — ABNORMAL LOW (ref 20–29)
Calcium: 9.9 mg/dL (ref 8.7–10.2)
Chloride: 102 mmol/L (ref 96–106)
Creatinine, Ser: 0.77 mg/dL (ref 0.76–1.27)
Globulin, Total: 3.9 g/dL (ref 1.5–4.5)
Glucose: 102 mg/dL — ABNORMAL HIGH (ref 70–99)
Potassium: 5.6 mmol/L — ABNORMAL HIGH (ref 3.5–5.2)
Sodium: 140 mmol/L (ref 134–144)
Total Protein: 8.6 g/dL — ABNORMAL HIGH (ref 6.0–8.5)
eGFR: 129 mL/min/{1.73_m2} (ref >59)

## 2022-04-07 LAB — QUANTIFERON-TB GOLD PLUS
QuantiFERON Mitogen Value: >10 IU/mL
QuantiFERON Nil Value: 0.01 IU/mL
QuantiFERON TB1 Ag Value: 0.01 IU/mL
QuantiFERON TB2 Ag Value: 0.01 IU/mL
QuantiFERON-TB Gold Plus: NEGATIVE

## 2022-04-07 LAB — HEPATITIS B SURFACE ANTIGEN: Hepatitis B Surface Ag: NEGATIVE

## 2022-04-07 LAB — HEPATITIS B CORE ANTIBODY, TOTAL: Hep B Core Total Ab: NEGATIVE

## 2022-04-07 LAB — HIV ANTIBODY (ROUTINE TESTING W REFLEX): HIV Screen 4th Generation wRfx: NONREACTIVE

## 2022-04-07 LAB — HEPATITIS B SURFACE ANTIBODY,QUALITATIVE: Hep B Surface Ab, Qual: NONREACTIVE

## 2022-04-07 LAB — HEPATITIS C ANTIBODY: Hep C Virus Ab: NONREACTIVE

## 2022-04-08 ENCOUNTER — Telehealth: Payer: Self-pay

## 2022-04-08 ENCOUNTER — Encounter: Payer: Self-pay | Admitting: Dermatology

## 2022-04-08 NOTE — Telephone Encounter (Signed)
Left message for patient to call office for results/hd 

## 2022-04-08 NOTE — Telephone Encounter (Signed)
-----   Message from Alfonso Patten, MD sent at 04/07/2022  5:22 PM EST ----- Labs show a couple of slight abnormalities on her metabolic panel including an elevated potassium - recommend following up with Dr. Zigmund Daniel.   Her blood work did not show any infections, so we can start the Cosentyx for her (please submit, and we can start her on samples if we have any).   However, it also showed that she is not immune to hepatitis B virus. Recommend following up with Dr. Zigmund Daniel for hepatitis B vaccine.   The vaccine does not work quite as well in people who are taking Cosentyx, so we could consider waiting to start the Cosentyx back until she has had her first dose or two of the hepatitis B vaccine if she prefers.   MAs please call. Thank you!

## 2022-04-12 NOTE — Telephone Encounter (Signed)
Patient had a question regarding whether or not she tested positive for Hep B and I advised her that she did not, but she also doesn't have immunity to the virus. Advised patient that Cosentyx can suppress her immune system making her more susceptible to contract Hep B, and Dr. Laurence Ferrari would like her to start the Hep B vaccination series before starting Cosentyx. I advised patient to contact her PCP to schedule appointment for Hep B vaccination and once she does we can start her on samples of Cosentyx until her insurance either approves or denies coverage.

## 2022-04-15 ENCOUNTER — Ambulatory Visit: Payer: 59 | Admitting: Family Medicine

## 2022-04-20 ENCOUNTER — Encounter: Payer: Self-pay | Admitting: Family Medicine

## 2022-04-20 ENCOUNTER — Ambulatory Visit (INDEPENDENT_AMBULATORY_CARE_PROVIDER_SITE_OTHER): Payer: Medicaid Other | Admitting: Family Medicine

## 2022-04-20 VITALS — BP 130/80 | HR 100 | Ht 67.0 in | Wt >= 6400 oz

## 2022-04-20 DIAGNOSIS — L400 Psoriasis vulgaris: Secondary | ICD-10-CM

## 2022-04-20 DIAGNOSIS — Z23 Encounter for immunization: Secondary | ICD-10-CM | POA: Diagnosis not present

## 2022-04-20 DIAGNOSIS — K5904 Chronic idiopathic constipation: Secondary | ICD-10-CM | POA: Diagnosis not present

## 2022-04-20 DIAGNOSIS — R030 Elevated blood-pressure reading, without diagnosis of hypertension: Secondary | ICD-10-CM

## 2022-04-20 DIAGNOSIS — Z7689 Persons encountering health services in other specified circumstances: Secondary | ICD-10-CM

## 2022-04-20 MED ORDER — TOPIRAMATE 50 MG PO TABS: 50.0000 mg | ORAL_TABLET | Freq: Every day | ORAL | 0 refills | Status: DC

## 2022-04-20 MED ORDER — METFORMIN HCL ER 750 MG PO TB24: 750.0000 mg | ORAL_TABLET | Freq: Every day | ORAL | 0 refills | Status: DC

## 2022-04-20 MED ORDER — BUPROPION HCL ER (XL) 150 MG PO TB24: 150.0000 mg | ORAL_TABLET | Freq: Every day | ORAL | 0 refills | Status: DC

## 2022-04-20 MED ORDER — HEPATITIS B VAC RECOMB ADJ 20 MCG/0.5ML IM SOSY: 0.5000 mL | PREFILLED_SYRINGE | Freq: Once | INTRAMUSCULAR | 0 refills | Status: DC

## 2022-04-20 NOTE — Assessment & Plan Note (Signed)
Noted in the setting of BMI 68.76, is asymptomatic from a cardiopulmonary standpoint and findings are consistent with the same.  Plan for aggressive weight management, will be obtaining risk stratification labs at annual physical and if continued elevated readings noted, BP medication to be initiated.

## 2022-04-20 NOTE — Patient Instructions (Signed)
-  Start new medications daily - Review information attached on healthy lifestyle changes - Increase physical activity and work on dietary changes - Return for follow-up 05/21/2022 (or later) - Contact for any questions/concerns between now and then

## 2022-04-20 NOTE — Telephone Encounter (Signed)
If she is doing ok, I would recommend waiting until 1-2 weeks after her second dose (1 month after her first dose) as a compromise between trying to get the vaccine to work without having her wait over 4 months to start cosentyx (if she waited to complete the whole series of vaccines). However, if she is significantly bothered by psoriasis, we could schedule her first cosentyx for 2 weeks from now so at least her first dose has had some time to work before starting the cosentyx.

## 2022-04-20 NOTE — Progress Notes (Signed)
Primary Care / Sports Medicine Office Visit  Patient Information:  Patient ID: Bobby Russo, adult DOB: 07-May-2000 Age: 22 y.o. MRN: [id number]   Bobby Russo is a pleasant 23 y.o. adult presenting with the following:  Chief Complaint  Patient presents with   Hep B vaccine    Vitals:   04/20/22 1023  BP: 130/80  Pulse: 100  SpO2: 98%   Vitals:   04/20/22 1023  Weight: (!) 439 lb (199.1 kg)  Height: 5\' 7"  (1.702 m)   Body mass index is 68.76 kg/m.  No results found.   Independent interpretation of notes and tests performed by another provider:   None  Procedures performed:   None  Pertinent History, Exam, Impression, and Recommendations:   Bobby Russo was seen today for hep b vaccine.  Encounter for weight management Assessment & Plan: Chronic condition despite exercise, attempts at dietary modifications.  Not amenable to surgical interventions.  We spent a length of time discussing pharmacotherapeutic options, given comorbid elevated BP readings, history of constipation, will utilize a combination of metformin, bupropion, and topiramate.  Low-dose was prescribed, appropriate dosing and adverse effects discussed, will return in 1 month for weight check, can consider titration if tolerating well.  Orders: -     buPROPion HCl ER (XL); Take 1 tablet (150 mg total) by mouth daily. 1 tablet daily for a month then 1 tab twice a day  Dispense: 30 tablet; Refill: 0 -     metFORMIN HCl ER; Take 1 tablet (750 mg total) by mouth daily with breakfast.  Dispense: 30 tablet; Refill: 0 -     Topiramate; Take 1 tablet (50 mg total) by mouth daily.  Dispense: 30 tablet; Refill: 0  Need for Tdap vaccination -     Tdap vaccine greater than or equal to 7yo IM  Need for hepatitis B booster vaccination -     Heplisav-B (HepB-CPG) Vaccine  Elevated blood pressure reading in office without diagnosis of hypertension Assessment & Plan: Noted in the setting of BMI 68.76, is  asymptomatic from a cardiopulmonary standpoint and findings are consistent with the same.  Plan for aggressive weight management, will be obtaining risk stratification labs at annual physical and if continued elevated readings noted, BP medication to be initiated.   Chronic idiopathic constipation Assessment & Plan: Chronic, stable, using OTC regimens outlined previously.  Anticipate improvement with initiation of metformin.  Will track this at her return.   Plaque psoriasis Assessment & Plan: Has established with dermatology, plan to initiate Cosentyx, hepatitis B vaccine administered today.  Will return in 1 month for second dose.  Will follow peripherally on this issue.      Orders & Medications Meds ordered this encounter  Medications   DISCONTD: hepatitis b vaccine (HEPLISAV-B) injection    Sig: Inject 0.5 mLs into the muscle once for 1 dose.    Dispense:  0.5 mL    Refill:  0   buPROPion (WELLBUTRIN XL) 150 MG 24 hr tablet    Sig: Take 1 tablet (150 mg total) by mouth daily. 1 tablet daily for a month then 1 tab twice a day    Dispense:  30 tablet    Refill:  0   metFORMIN (GLUCOPHAGE-XR) 750 MG 24 hr tablet    Sig: Take 1 tablet (750 mg total) by mouth daily with breakfast.    Dispense:  30 tablet    Refill:  0   topiramate (TOPAMAX) 50 MG tablet  Sig: Take 1 tablet (50 mg total) by mouth daily.    Dispense:  30 tablet    Refill:  0   Orders Placed This Encounter  Procedures   Tdap vaccine greater than or equal to 7yo IM   Heplisav-B (HepB-CPG) Vaccine     Return in about 1 month (around 05/21/2022) for weight check and vaccine #2.     Montel Culver, MD, [facility]   Primary Care Sports Medicine Primary Care and Sports Medicine at [facility]

## 2022-04-20 NOTE — Assessment & Plan Note (Signed)
Has established with dermatology, plan to initiate Cosentyx, hepatitis B vaccine administered today.  Will return in 1 month for second dose.  Will follow peripherally on this issue.

## 2022-04-20 NOTE — Assessment & Plan Note (Signed)
Chronic, stable, using OTC regimens outlined previously.  Anticipate improvement with initiation of metformin.  Will track this at her return.

## 2022-04-20 NOTE — Assessment & Plan Note (Signed)
Chronic condition despite exercise, attempts at dietary modifications.  Not amenable to surgical interventions.  We spent a length of time discussing pharmacotherapeutic options, given comorbid elevated BP readings, history of constipation, will utilize a combination of metformin, bupropion, and topiramate.  Low-dose was prescribed, appropriate dosing and adverse effects discussed, will return in 1 month for weight check, can consider titration if tolerating well.

## 2022-04-30 ENCOUNTER — Other Ambulatory Visit: Payer: Self-pay | Admitting: Dermatology

## 2022-05-04 ENCOUNTER — Encounter: Payer: Self-pay | Admitting: Dermatology

## 2022-05-04 ENCOUNTER — Ambulatory Visit (INDEPENDENT_AMBULATORY_CARE_PROVIDER_SITE_OTHER): Payer: 59 | Admitting: Dermatology

## 2022-05-04 DIAGNOSIS — L409 Psoriasis, unspecified: Secondary | ICD-10-CM | POA: Diagnosis not present

## 2022-05-04 DIAGNOSIS — Z7960 Long term (current) use of unspecified immunomodulators and immunosuppressants: Secondary | ICD-10-CM

## 2022-05-04 DIAGNOSIS — L299 Pruritus, unspecified: Secondary | ICD-10-CM | POA: Diagnosis not present

## 2022-05-04 MED ORDER — COSENTYX SENSOREADY (300 MG) 150 MG/ML ~~LOC~~ SOAJ: 300.0000 mg | SUBCUTANEOUS | 0 refills | Status: DC

## 2022-05-04 MED ORDER — FLUOCINOLONE ACETONIDE BODY 0.01 % EX OIL: TOPICAL_OIL | CUTANEOUS | 2 refills | Status: DC

## 2022-05-04 MED ORDER — COSENTYX SENSOREADY (300 MG) 150 MG/ML ~~LOC~~ SOAJ: 300.0000 mg | SUBCUTANEOUS | 4 refills | Status: DC

## 2022-05-04 MED ORDER — ZORYVE 0.3 % EX FOAM: 1.0000 "application " | Freq: Every day | CUTANEOUS | 2 refills | Status: DC

## 2022-05-04 NOTE — Patient Instructions (Addendum)
Continue Zoryve switching to foam once daily.  Start fluocinolone oil 1-2 times daily to scalp as needed.  Start Cosentyx.  Reviewed risks of biologics including immunosuppression, infections, injection site reaction, and failure to improve condition. Goal is control of skin condition, not cure.  Some older biologics such as Humira and Enbrel may slightly increase risk of malignancy and may worsen congestive heart failure.  Talz and Cosentyx may cause inflammatory bowel disease to flare. The use of biologics requires long term medication management, including periodic office visits and monitoring of blood work.  Due to recent changes in healthcare laws, you may see results of your pathology and/or laboratory studies on MyChart before the doctors have had a chance to review them. We understand that in some cases there may be results that are confusing or concerning to you. Please understand that not all results are received at the same time and often the doctors may need to interpret multiple results in order to provide you with the best plan of care or course of treatment. Therefore, we ask that you please give [place] 2 business days to thoroughly review all your results before contacting the office for clarification. Should we see a critical lab result, you will be contacted sooner.   If You Need Anything After Your Visit  If you have any questions or concerns for your doctor, please call our main line at [phone] and press option 4 to reach your doctor's medical assistant. If no one answers, please leave a voicemail as directed and we will return your call as soon as possible. Messages left after 4 pm will be answered the following business day.   You may also send [place] a message via [place]. We typically respond to MyChart messages within 1-2 business days.  For prescription refills, please ask your pharmacy to contact our office. Our fax number is [phone].  If you have an urgent issue when  the clinic is closed that cannot wait until the next business day, you can page your doctor at the number below.    Please note that while we do our best to be available for urgent issues outside of office hours, we are not available 24/7.   If you have an urgent issue and are unable to reach [place], you may choose to seek medical care at your doctor's office, retail clinic, urgent care center, or emergency room.  If you have a medical emergency, please immediately call 911 or go to the emergency department.  Pager Numbers  - Dr. Nehemiah Massed: [phone]  - Dr. Laurence Ferrari: [phone]  - Dr. Nicole Kindred: [phone]  In the event of inclement weather, please call our main line at [phone] for an update on the status of any delays or closures.  Dermatology Medication Tips: Please keep the boxes that topical medications come in in order to help keep track of the instructions about where and how to use these. Pharmacies typically print the medication instructions only on the boxes and not directly on the medication tubes.   If your medication is too expensive, please contact our office at [phone] option 4 or send [place] a message through [place].   We are unable to tell what your co-pay for medications will be in advance as this is different depending on your insurance coverage. However, we may be able to find a substitute medication at lower cost or fill out paperwork to get insurance to cover a needed medication.   If a prior authorization is required to get your  medication covered by your insurance company, please allow [place] 1-2 business days to complete this process.  Drug prices often vary depending on where the prescription is filled and some pharmacies may offer cheaper prices.  The website www.goodrx.com contains coupons for medications through different pharmacies. The prices here do not account for what the cost may be with help from insurance (it may be cheaper with your insurance), but the  website can give you the price if you did not use any insurance.  - You can print the associated coupon and take it with your prescription to the pharmacy.  - You may also stop by our office during regular business hours and pick up a GoodRx coupon card.  - If you need your prescription sent electronically to a different pharmacy, notify our office through [facility] or by phone at [phone] option 4.     Si Usted Necesita Algo Despus de Su Visita  Tambin puede enviarnos un mensaje a travs de Pharmacist, community. Por lo general respondemos a los mensajes de MyChart en el transcurso de 1 a 2 das hbiles.  Para renovar recetas, por favor pida a su farmacia que se ponga en contacto con nuestra oficina. Harland Dingwall de fax es [place] [phone].  Si tiene un asunto urgente cuando la clnica est cerrada y que no puede esperar hasta el siguiente da hbil, puede llamar/localizar a su doctor(a) al nmero que aparece a continuacin.   Por favor, tenga en cuenta que aunque hacemos todo lo posible para estar disponibles para asuntos urgentes fuera del horario de [place], no estamos disponibles las 24 horas del da, los 7 das de la [place].   Si tiene un problema urgente y no puede comunicarse con nosotros, puede optar por buscar atencin mdica  en el consultorio de su doctor(a), en una clnica privada, en un centro de atencin urgente o en una sala de emergencias.  Si tiene Engineering geologist, por favor llame inmediatamente al 911 o vaya a la sala de emergencias.  Nmeros de bper  - Dr. Nehemiah Massed: [phone]  - Dra. Moye: [phone]  - Dra. Nicole Kindred: [phone]  En caso de inclemencias del [place], por favor llame a Johnsie Kindred principal al [phone] para una actualizacin sobre el [place] de cualquier retraso o cierre.  Consejos para la medicacin en dermatologa: Por favor, guarde las cajas en las que vienen los medicamentos de uso tpico para ayudarle a seguir las  instrucciones sobre dnde y cmo usarlos. Las farmacias generalmente imprimen las instrucciones del medicamento slo en las cajas y no directamente en los tubos del [place].   Si su medicamento es muy caro, por favor, pngase en contacto con Zigmund Daniel llamando al [phone] y presione la opcin 4 o envenos un mensaje a travs de Pharmacist, community.   No podemos decirle cul ser su copago por los medicamentos por adelantado ya que esto es diferente dependiendo de la cobertura de su seguro. Sin embargo, es posible que podamos encontrar un medicamento sustituto a Electrical engineer un formulario para que el seguro cubra el medicamento que se considera necesario.   Si se requiere una autorizacin previa para que su compaa de seguros [place] su medicamento, por favor permtanos de 1 a 2 das hbiles para completar este proceso.  Los precios de los medicamentos varan con frecuencia dependiendo del Environmental consultant de dnde se surte la receta y alguna farmacias pueden ofrecer precios ms baratos.  El sitio web www.goodrx.com tiene cupones para medicamentos de Airline pilot. [place]  en cuenta lo que podra costar con la ayuda del seguro (puede ser ms barato con su seguro), pero el sitio web puede darle el precio si no utiliz ningn seguro.  - Puede imprimir el cupn correspondiente y llevarlo con su receta a la farmacia.  - Tambin puede pasar por nuestra oficina durante el horario de atencin regular y recoger una tarjeta de cupones de GoodRx.  - Si necesita que su receta se enve electrnicamente a una farmacia diferente, informe a nuestra oficina a travs de MyChart de [place] o por telfono llamando al [phone] y presione la opcin 4.  

## 2022-05-04 NOTE — Progress Notes (Signed)
Follow-Up Visit   Subjective  Bobby Russo is a 22 y.o. adult who presents for the following: Psoriasis (Patient currently using Zoryve twice daily. She has noticed improvement. Patient would like to start Cosentyx injections today. ).  Patient scheduled 2/23 to have 2nd dose of Hepatitis B vaccine.   The following portions of the chart were reviewed this encounter and updated as appropriate:   Tobacco  Allergies  Meds  Problems  Med Hx  Surg Hx  Fam Hx      Review of Systems:  No other skin or systemic complaints except as noted in HPI or Assessment and Plan.  Objective  Well appearing patient in no apparent distress; mood and affect are within normal limits.  A focused examination was performed including arms. Relevant physical exam findings are noted in the Assessment and Plan.  scalp, face, chest, back, legs Well-demarcated erythematous papules/plaques with silvery scale, guttate pink scaly papules.    Assessment & Plan  Psoriasis scalp, face, chest, back, legs  Chronic and persistent condition with duration or expected duration over one year. Condition is bothersome/symptomatic for patient. Currently flared.  Counseling on psoriasis and coordination of care  psoriasis is a chronic non-curable, but treatable genetic/hereditary disease that may have other systemic features affecting other organ systems such as joints (Psoriatic Arthritis). It is associated with an increased risk of inflammatory bowel disease, heart disease, non-alcoholic fatty liver disease, and depression.  Treatments include light and laser treatments; topical medications; and systemic medications including oral and injectables.  Reviewed risks of biologics including immunosuppression, infections, injection site reaction, and failure to improve condition. Goal is control of skin condition, not cure.  Some older biologics such as Humira and Enbrel may slightly increase risk of malignancy and may  worsen congestive heart failure.  Talz and Cosentyx may cause inflammatory bowel disease to flare. The use of biologics requires long term medication management, including periodic office visits and monitoring of blood work.  No joint pain.  Continue Zoryve switching to foam once daily.  Start fluocinolone oil 1-2 times daily to scalp as needed.  Start Cosentyx. Advised that hepatitis B vaccines received after starting cosentyx will not be as effective.  Sample of Cosentyx 300mg /mL injected today to bilateral upper arms.  Lot # [id number]  Exp: Feb 2025 Patient tolerated well. Sample of Cosentyx 300mg /mL given to patient to inject at home in 1 week for 2nd dose of loading dose.  Lot # [id number]  Exp: Feb 2025 Patient will come by office for sample of 3rd loading dose if rx not received when 3rd dose due.   Secukinumab, 300 MG Dose, (COSENTYX SENSOREADY, 300 MG,) 150 MG/ML SOAJ - scalp, face, chest, back, legs Inject 300 mg into the skin as directed. On week 0, 1, 2, 3 and 4.  Secukinumab, 300 MG Dose, (COSENTYX SENSOREADY, 300 MG,) 150 MG/ML SOAJ - scalp, face, chest, back, legs Inject 300 mg into the skin every 28 (twenty-eight) days. For maintenance.  Roflumilast, Antiseborrheic, (ZORYVE) 0.3 % FOAM - scalp, face, chest, back, legs Apply 1 application  topically daily. Apply to affected areas of psoriasis at scalp and body once daily.  Fluocinolone Acetonide Body 0.01 % OIL - scalp, face, chest, back, legs Apply to affected areas of psoriasis at scalp 1-2 times daily as needed,  Related Medications Secukinumab, 300 MG Dose, (COSENTYX SENSOREADY, 300 MG,) 150 MG/ML SOAJ Inject 300 mg into the skin as directed. On week 0, 1, 2, 3 and 4.  Itch  Scalp  At scalp  Secondary to psoriasis  Start Tgel shampoo  Start zoryve foam daily  Start fluocinolone oil once to twice a day as needed  Long-term use of immunosuppressant medication   Return in about 4 months (around 09/02/2022) for  Psoriasis.  Graciella Belton, RMA, am acting as scribe for Forest Gleason, MD .  Documentation: I have reviewed the above documentation for accuracy and completeness, and I agree with the above.  Forest Gleason, MD

## 2022-05-13 ENCOUNTER — Ambulatory Visit: Payer: 59 | Admitting: Dermatology

## 2022-05-18 ENCOUNTER — Telehealth: Payer: Self-pay

## 2022-05-18 NOTE — Telephone Encounter (Signed)
Patient came to pick up 1 more sample of Cosentyx due to delay in PA approval and shipments.   LOT: [id number] EXP: 07/2023

## 2022-05-21 ENCOUNTER — Ambulatory Visit: Payer: Medicaid Other | Admitting: Family Medicine

## 2022-05-21 ENCOUNTER — Encounter: Payer: Self-pay | Admitting: Family Medicine

## 2022-05-25 ENCOUNTER — Encounter: Payer: Self-pay | Admitting: Family Medicine

## 2022-07-01 DIAGNOSIS — F649 Gender identity disorder, unspecified: Secondary | ICD-10-CM | POA: Diagnosis not present

## 2022-08-11 ENCOUNTER — Encounter: Payer: Self-pay | Admitting: Family Medicine

## 2022-08-12 ENCOUNTER — Other Ambulatory Visit: Payer: Self-pay

## 2022-08-12 DIAGNOSIS — Z7689 Persons encountering health services in other specified circumstances: Secondary | ICD-10-CM

## 2022-08-12 DIAGNOSIS — Z6841 Body Mass Index (BMI) 40.0 and over, adult: Secondary | ICD-10-CM

## 2022-08-12 NOTE — Telephone Encounter (Signed)
Please advise 

## 2022-08-12 NOTE — Telephone Encounter (Signed)
Referral placed.

## 2022-09-02 ENCOUNTER — Ambulatory Visit: Payer: 59 | Admitting: Dermatology

## 2022-11-10 ENCOUNTER — Ambulatory Visit (INDEPENDENT_AMBULATORY_CARE_PROVIDER_SITE_OTHER): Payer: 59 | Admitting: Dermatology

## 2022-11-10 ENCOUNTER — Encounter: Payer: Self-pay | Admitting: Dermatology

## 2022-11-10 DIAGNOSIS — L409 Psoriasis, unspecified: Secondary | ICD-10-CM | POA: Diagnosis not present

## 2022-11-10 DIAGNOSIS — Z79899 Other long term (current) drug therapy: Secondary | ICD-10-CM | POA: Diagnosis not present

## 2022-11-10 DIAGNOSIS — L818 Other specified disorders of pigmentation: Secondary | ICD-10-CM

## 2022-11-10 DIAGNOSIS — Z7189 Other specified counseling: Secondary | ICD-10-CM | POA: Diagnosis not present

## 2022-11-10 MED ORDER — ZORYVE 0.3 % EX CREA: TOPICAL_CREAM | CUTANEOUS | 5 refills | Status: AC

## 2022-11-10 MED ORDER — COSENTYX SENSOREADY (300 MG) 150 MG/ML ~~LOC~~ SOAJ
300.0000 mg | SUBCUTANEOUS | 5 refills | Status: AC
Start: 2022-11-10 — End: ?

## 2022-11-10 MED ORDER — TRIAMCINOLONE ACETONIDE 0.1 % EX OINT: 1.0000 | TOPICAL_OINTMENT | Freq: Two times a day (BID) | CUTANEOUS | 2 refills | Status: AC | PRN

## 2022-11-10 NOTE — Progress Notes (Signed)
Follow-Up Visit   Subjective  Bobby Russo is a 22 y.o. adult who presents for the following: Psoriasis  Legs and arms. Patient currently on Cosentyx, last injections were 4 weeks ago. She has also used Zoryve but is out of refills of both. Patient has used TMC ointment in the past and felt like it helped a lot with her legs. Patient reports no side effects on Cosentyx and psoriasis is improved. Scalp has cleared. Did not affect ears. Extremities and trunk have significantly improved. Few residual lesions on extremities   The following portions of the chart were reviewed this encounter and updated as appropriate: medications, allergies, medical history  Review of Systems:  No other skin or systemic complaints except as noted in HPI or Assessment and Plan.  Objective  Well appearing patient in no apparent distress; mood and affect are within normal limits.  Areas Examined: Scalp, back, arms, legs, face and neck  Relevant exam findings are noted in the Assessment and Plan.      Assessment & Plan   Psoriasis  Related Medications Secukinumab, 300 MG Dose, (COSENTYX SENSOREADY, 300 MG,) 150 MG/ML SOAJ Inject 300 mg into the skin as directed. On week 0, 1, 2, 3 and 4.  Roflumilast, Antiseborrheic, (ZORYVE) 0.3 % FOAM Apply 1 application  topically daily. Apply to affected areas of psoriasis at scalp and body once daily.  Fluocinolone Acetonide Body 0.01 % OIL Apply to affected areas of psoriasis at scalp 1-2 times daily as needed,  Secukinumab, 300 MG Dose, (COSENTYX SENSOREADY, 300 MG,) 150 MG/ML SOAJ Inject 2 mLs (300 mg total) into the skin every 28 (twenty-eight) days. For maintenance.    PSORIASIS Well-demarcated erythematous papules/plaques with silvery scale, guttate pink scaly papules on extremities > trunk. < 5% BSA. Postinflammatory hypopigmented patches from prior lesions  Chronic and persistent condition with duration or expected duration over one year.  Condition is symptomatic/bothersome to patient. Significantly improved on Cosentyx but not currently at goal.   Patient denies joint pain. Patient pleased with progress and is at 8 on a scale of 1-10 in terms of how psoriasis is doing.   Treatment Plan: Continue Cosentyx 300 mg/39mL SQ Q2 wks. Risk of URI candidiasis TB reactivation. Needs repeat quant gold at follow up Continue Zoryve cream once daily to affected areas of psoriasis as needed.  Start TMC 0.1% ointment twice daily to affected areas until smooth. Avoid applying to face, groin, and axilla. Use as directed. Long-term use can cause thinning of the skin. Postinflammatory hypopigmentation will self-resolve with time  If patient not continuing to improve, consider switching biologic and/or adding other topicals.   Will order Quantiferon Gold at next visit.   Counseling on psoriasis and coordination of care  psoriasis is a chronic non-curable, but treatable genetic/hereditary disease that may have other systemic features affecting other organ systems such as joints (Psoriatic Arthritis). It is associated with an increased risk of inflammatory bowel disease, heart disease, non-alcoholic fatty liver disease, and depression.  Treatments include light and laser treatments; topical medications; and systemic medications including oral and injectables.  Reviewed risks of biologics including immunosuppression, infections, injection site reaction, and failure to improve condition. Goal is control of skin condition, not cure.  Some older biologics such as Humira and Enbrel may slightly increase risk of malignancy and may worsen congestive heart failure.  Taltz and Cosentyx may cause inflammatory bowel disease to flare. The use of biologics requires long term medication management, including periodic office visits and monitoring  of blood work.  Topical steroids (such as triamcinolone, fluocinolone, fluocinonide, mometasone, clobetasol, halobetasol,  betamethasone, hydrocortisone) can cause thinning and lightening of the skin if they are used for too long in the same area. Your physician has selected the right strength medicine for your problem and area affected on the body. Please use your medication only as directed by your physician to prevent side effects.     Return in about 5 months (around 04/12/2023) for Psoriasis.  Anise Salvo, RMA, am acting as scribe for Elie Goody, MD .   Documentation: I have reviewed the above documentation for accuracy and completeness, and I agree with the above.  Elie Goody, MD

## 2022-11-10 NOTE — Patient Instructions (Signed)
Continue Cosentyx 300 mg/67mL SQ Q2 wks.  Continue Zoryve cream once daily to affected areas of psoriasis as needed.  Start TMC 0.1% ointment twice daily to affected areas until smooth. Avoid applying to face, groin, and axilla. Use as directed. Long-term use can cause thinning of the skin.  Will order Quantiferon Gold at next visit.   Counseling on psoriasis and coordination of care  psoriasis is a chronic non-curable, but treatable genetic/hereditary disease that may have other systemic features affecting other organ systems such as joints (Psoriatic Arthritis). It is associated with an increased risk of inflammatory bowel disease, heart disease, non-alcoholic fatty liver disease, and depression.  Treatments include light and laser treatments; topical medications; and systemic medications including oral and injectables.  Reviewed risks of biologics including immunosuppression, infections, injection site reaction, and failure to improve condition. Goal is control of skin condition, not cure.  Some older biologics such as Humira and Enbrel may slightly increase risk of malignancy and may worsen congestive heart failure.  Taltz and Cosentyx may cause inflammatory bowel disease to flare. The use of biologics requires long term medication management, including periodic office visits and monitoring of blood work.  Topical steroids (such as triamcinolone, fluocinolone, fluocinonide, mometasone, clobetasol, halobetasol, betamethasone, hydrocortisone) can cause thinning and lightening of the skin if they are used for too long in the same area. Your physician has selected the right strength medicine for your problem and area affected on the body. Please use your medication only as directed by your physician to prevent side effects.   Due to recent changes in healthcare laws, you may see results of your pathology and/or laboratory studies on MyChart before the doctors have had a chance to review them. We  understand that in some cases there may be results that are confusing or concerning to you. Please understand that not all results are received at the same time and often the doctors may need to interpret multiple results in order to provide you with the best plan of care or course of treatment. Therefore, we ask that you please give [place] 2 business days to thoroughly review all your results before contacting the office for clarification. Should we see a critical lab result, you will be contacted sooner.   If You Need Anything After Your Visit  If you have any questions or concerns for your doctor, please call our main line at [phone] and press option 4 to reach your doctor's medical assistant. If no one answers, please leave a voicemail as directed and we will return your call as soon as possible. Messages left after 4 pm will be answered the following business day.   You may also send [place] a message via MyChart. We typically respond to MyChart messages within 1-2 business days.  For prescription refills, please ask your pharmacy to contact our office. Our fax number is [phone].  If you have an urgent issue when the clinic is closed that cannot wait until the next business day, you can page your doctor at the number below.    Please note that while we do our best to be available for urgent issues outside of office hours, we are not available 24/7.   If you have an urgent issue and are unable to reach [place], you may choose to seek medical care at your doctor's office, retail clinic, urgent care center, or emergency room.  If you have a medical emergency, please immediately call 911 or go to the emergency department.  Pager Numbers  -  Dr. Gwen Pounds: [phone]  - Dr. Roseanne Reno: [phone]  - Dr. Katrinka Blazing: [phone]   In the event of inclement weather, please call our main line at [phone] for an update on the status of any delays or closures.  Dermatology Medication Tips: Please  keep the boxes that topical medications come in in order to help keep track of the instructions about where and how to use these. Pharmacies typically print the medication instructions only on the boxes and not directly on the medication tubes.   If your medication is too expensive, please contact our office at [phone] option 4 or send [place] a message through MyChart.   We are unable to tell what your co-pay for medications will be in advance as this is different depending on your insurance coverage. However, we may be able to find a substitute medication at lower cost or fill out paperwork to get insurance to cover a needed medication.   If a prior authorization is required to get your medication covered by your insurance company, please allow [place] 1-2 business days to complete this process.  Drug prices often vary depending on where the prescription is filled and some pharmacies may offer cheaper prices.  The website www.goodrx.com contains coupons for medications through different pharmacies. The prices here do not account for what the cost may be with help from insurance (it may be cheaper with your insurance), but the website can give you the price if you did not use any insurance.  - You can print the associated coupon and take it with your prescription to the pharmacy.  - You may also stop by our office during regular business hours and pick up a GoodRx coupon card.  - If you need your prescription sent electronically to a different pharmacy, notify our office through [facility] or by phone at [phone] option 4.     Si Usted Necesita Algo Despus de Su Visita  Tambin puede enviarnos un mensaje a travs de Clinical cytogeneticist. Por lo general respondemos a los mensajes de MyChart en el transcurso de 1 a 2 das hbiles.  Para renovar recetas, por favor pida a su farmacia que se ponga en contacto con nuestra oficina. Annie Sable de fax es [place] [phone].  Si tiene un asunto urgente  cuando la clnica est cerrada y que no puede esperar hasta el siguiente da hbil, puede llamar/localizar a su doctor(a) al nmero que aparece a continuacin.   Por favor, tenga en cuenta que aunque hacemos todo lo posible para estar disponibles para asuntos urgentes fuera del horario de [place], no estamos disponibles las 24 horas del da, los 7 [address]  [address] de la [place].   Si tiene un problema urgente y no puede comunicarse con nosotros, puede optar por buscar atencin mdica  en el consultorio de su doctor(a), en una clnica privada, en un centro de atencin urgente o en una sala de emergencias.  Si tiene Engineer, drilling, por favor llame inmediatamente al 911 o vaya a la sala de emergencias.  Nmeros de bper  - Dr. Gwen Pounds: [phone]  - Dra. Roseanne Reno: [phone]  - Dr. Katrinka Blazing: [phone]   En caso de inclemencias del tiempo, por favor llame a Lacy Duverney principal al [phone] para una actualizacin sobre el [place] de cualquier retraso o cierre.  Consejos para la medicacin en dermatologa: Por favor, guarde las cajas en las que vienen los medicamentos de uso tpico para ayudarle a seguir las instrucciones sobre dnde y cmo usarlos. Las farmacias generalmente imprimen las instrucciones  del medicamento slo en las cajas y no directamente en los tubos del medicamento.   Si su medicamento es muy caro, por favor, pngase en contacto con Rolm Gala llamando al [phone] y presione la opcin 4 o envenos un mensaje a travs de Clinical cytogeneticist.   No podemos decirle cul ser su copago por los medicamentos por adelantado ya que esto es diferente dependiendo de la cobertura de su seguro. Sin embargo, es posible que podamos encontrar un medicamento sustituto a Audiological scientist un formulario para que el seguro cubra el medicamento que se considera necesario.   Si se requiere una autorizacin previa para que su compaa de seguros [place] su medicamento, por favor permtanos de 1 a 2  das hbiles para completar [address]  Los precios de los medicamentos varan con frecuencia dependiendo del Environmental consultant de dnde se surte la receta y alguna farmacias pueden ofrecer precios ms baratos.  El sitio web www.goodrx.com tiene cupones para medicamentos de Health and safety inspector. Los precios aqu no tienen en cuenta lo que podra costar con la ayuda del seguro (puede ser ms barato con su seguro), pero el sitio web puede darle el precio si no utiliz Tourist information centre manager.  - Puede imprimir el cupn correspondiente y llevarlo con su receta a la farmacia.  - Tambin puede pasar por nuestra oficina durante el horario de atencin regular y Education officer, museum una tarjeta de cupones de GoodRx.  - Si necesita que su receta se enve electrnicamente a una farmacia diferente, informe a nuestra oficina a travs de MyChart de [place] o por telfono llamando al [phone] y presione la opcin 4.

## 2022-11-23 DIAGNOSIS — Z79890 Hormone replacement therapy (postmenopausal): Secondary | ICD-10-CM | POA: Diagnosis not present

## 2022-11-23 DIAGNOSIS — Z87891 Personal history of nicotine dependence: Secondary | ICD-10-CM | POA: Diagnosis not present

## 2022-11-23 DIAGNOSIS — L409 Psoriasis, unspecified: Secondary | ICD-10-CM | POA: Diagnosis not present

## 2022-11-23 DIAGNOSIS — Z8249 Family history of ischemic heart disease and other diseases of the circulatory system: Secondary | ICD-10-CM | POA: Diagnosis not present

## 2022-11-23 DIAGNOSIS — Z79620 Long term (current) use of immunosuppressive biologic: Secondary | ICD-10-CM | POA: Diagnosis not present

## 2023-04-12 ENCOUNTER — Ambulatory Visit: Payer: 59 | Admitting: Dermatology

## 2023-05-03 ENCOUNTER — Telehealth: Payer: Self-pay

## 2023-05-03 NOTE — Telephone Encounter (Signed)
[facility] pharmacy calling requesting a refill of Cosentyx for this patient, patient will need to schedule an office visit before any refills

## 2023-05-23 ENCOUNTER — Telehealth: Payer: Self-pay

## 2023-05-23 DIAGNOSIS — Z7960 Long term (current) use of unspecified immunomodulators and immunosuppressants: Secondary | ICD-10-CM

## 2023-05-23 DIAGNOSIS — L409 Psoriasis, unspecified: Secondary | ICD-10-CM

## 2023-05-23 NOTE — Telephone Encounter (Signed)
 Patient is requesting a RF of Cosentyx.   Scheduled follow up with you at the end of next month.  Okay to send one in or should patient wait?

## 2023-05-23 NOTE — Telephone Encounter (Signed)
 Patient sent appt request: Good morning I was wondering if Dr Katrinka Blazing could approve the refill of my prescription. cause I'm starting to have a bad flareup. I talked to the specialty pharmacy they are just waiting for approval if someone can please call me [phone]

## 2023-05-24 NOTE — Addendum Note (Signed)
 Addended by: Dorathy Daft R on: 05/24/2023 08:41 AM   Modules accepted: Orders

## 2023-06-27 ENCOUNTER — Ambulatory Visit: Payer: 59 | Admitting: Dermatology

## 2023-10-17 ENCOUNTER — Ambulatory Visit: Admitting: Dermatology

## 2023-12-26 ENCOUNTER — Ambulatory Visit: Admitting: Dermatology

## 2024-01-16 ENCOUNTER — Ambulatory Visit
